# Patient Record
Sex: Male | Born: 1950
Health system: Southern US, Community
[De-identification: ages and names within clinical notes are randomized; demographics above are authoritative.]

## PROBLEM LIST (undated history)

## (undated) DIAGNOSIS — J309 Allergic rhinitis, unspecified: Secondary | ICD-10-CM

## (undated) DIAGNOSIS — H9313 Tinnitus, bilateral: Secondary | ICD-10-CM

## (undated) DIAGNOSIS — M48061 Spinal stenosis, lumbar region without neurogenic claudication: Secondary | ICD-10-CM

## (undated) DIAGNOSIS — E785 Hyperlipidemia, unspecified: Secondary | ICD-10-CM

## (undated) DIAGNOSIS — D126 Benign neoplasm of colon, unspecified: Secondary | ICD-10-CM

## (undated) DIAGNOSIS — E8809 Other disorders of plasma-protein metabolism, not elsewhere classified: Secondary | ICD-10-CM

## (undated) DIAGNOSIS — K589 Irritable bowel syndrome without diarrhea: Secondary | ICD-10-CM

## (undated) DIAGNOSIS — N4341 Spermatocele of epididymis, single: Secondary | ICD-10-CM

## (undated) DIAGNOSIS — E039 Hypothyroidism, unspecified: Secondary | ICD-10-CM

## (undated) DIAGNOSIS — E079 Disorder of thyroid, unspecified: Secondary | ICD-10-CM

## (undated) DIAGNOSIS — H8110 Benign paroxysmal vertigo, unspecified ear: Secondary | ICD-10-CM

## (undated) DIAGNOSIS — R918 Other nonspecific abnormal finding of lung field: Secondary | ICD-10-CM

## (undated) DIAGNOSIS — E559 Vitamin D deficiency, unspecified: Secondary | ICD-10-CM

## (undated) DIAGNOSIS — N503 Cyst of epididymis: Secondary | ICD-10-CM

## (undated) DIAGNOSIS — G629 Polyneuropathy, unspecified: Secondary | ICD-10-CM

## (undated) HISTORY — DX: Spermatocele of epididymis, single: N43.41

## (undated) HISTORY — DX: Allergic rhinitis, unspecified: J30.9

## (undated) HISTORY — DX: Benign neoplasm of colon, unspecified: D12.6

## (undated) HISTORY — DX: Tinnitus, bilateral: H93.13

## (undated) HISTORY — PX: APPENDECTOMY: SHX54

## (undated) HISTORY — DX: Other disorders of plasma-protein metabolism, not elsewhere classified: E88.09

## (undated) HISTORY — DX: Polyneuropathy, unspecified: G62.9

## (undated) HISTORY — DX: Hyperlipidemia, unspecified: E78.5

## (undated) HISTORY — DX: Benign paroxysmal vertigo, unspecified ear: H81.10

## (undated) HISTORY — PX: OTHER SURGICAL HISTORY: SHX169

## (undated) HISTORY — DX: Irritable bowel syndrome, unspecified: K58.9

## (undated) HISTORY — DX: Hypothyroidism, unspecified: E03.9

## (undated) HISTORY — PX: COLONOSCOPY: SHX174

## (undated) HISTORY — DX: Cyst of epididymis: N50.3

## (undated) HISTORY — DX: Spinal stenosis, lumbar region without neurogenic claudication: M48.061

## (undated) HISTORY — DX: Vitamin D deficiency, unspecified: E55.9

## (undated) HISTORY — DX: Other nonspecific abnormal finding of lung field: R91.8

---

## 2006-12-24 ENCOUNTER — Ambulatory Visit (HOSPITAL_COMMUNITY): Admission: RE | Admit: 2006-12-24 | Discharge: 2006-12-24 | Payer: Self-pay | Admitting: Gastroenterology

## 2006-12-24 ENCOUNTER — Encounter (INDEPENDENT_AMBULATORY_CARE_PROVIDER_SITE_OTHER): Payer: Self-pay | Admitting: Gastroenterology

## 2010-05-28 NOTE — Op Note (Signed)
Jesus Sweeney, Jesus Sweeney               ACCOUNT NO.:  [id number]   MEDICAL RECORD NO.:  [id number]          PATIENT TYPE:  AMB   LOCATION:  ENDO                         FACILITY:  [facility]   PHYSICIAN:  John C. Madilyn Fireman, M.D.    DATE OF BIRTH:  16-Sep-1950   DATE OF PROCEDURE:  12/24/2006  DATE OF DISCHARGE:                               OPERATIVE REPORT   INDICATIONS FOR PROCEDURE:  Average risk colon cancer screening.   PROCEDURE:  The patient was placed in the left lateral decubitus  position and placed on the pulse monitor with continuous low-flow oxygen  delivered by nasal cannula.  He was sedated with 100 mcg IV fentanyl and  10 mg IV Versed.  The Olympus video colonoscope was inserted into the  rectum and advanced to the cecum, confirmed by transillumination of  McBurney's point and visualization of the ileocecal valve and  appendiceal orifice.  Prep was excellent.  The cecum appeared normal  with no masses, polyps, diverticula or other mucosal abnormalities.  Within the ascending colon, there was a single 6 mm polyp that was  fulgurated by hot biopsy.  The remainder of the ascending, transverse  descending, sigmoid and rectum appeared normal with no further polyps,  masses, diverticula or other mucosal abnormalities.  The scope was then  withdrawn and the patient returned to the recovery room in stable  condition.  He tolerated the procedure well.  There were no immediate  complications.   IMPRESSION:  Ascending colon polyp.   PLAN:  Await histology to determine interval for future colon screening.           ______________________________  Everardo All. Madilyn Fireman, M.D.     [facility]  D:  12/24/2006  T:  12/24/2006  Job:  [id number]   cc:   Juline Patch, M.D.  Fax: [phone]

## 2012-03-02 ENCOUNTER — Encounter (HOSPITAL_COMMUNITY): Payer: Self-pay | Admitting: *Deleted

## 2012-03-02 ENCOUNTER — Emergency Department (HOSPITAL_COMMUNITY)
Admission: EM | Admit: 2012-03-02 | Discharge: 2012-03-02 | Disposition: A | Discharge status: Home / Self Care | Payer: 59 | Attending: Emergency Medicine | Admitting: Emergency Medicine

## 2012-03-02 ENCOUNTER — Emergency Department (HOSPITAL_COMMUNITY): Payer: 59

## 2012-03-02 DIAGNOSIS — W19XXXA Unspecified fall, initial encounter: Secondary | ICD-10-CM

## 2012-03-02 DIAGNOSIS — Y9301 Activity, walking, marching and hiking: Secondary | ICD-10-CM | POA: Insufficient documentation

## 2012-03-02 DIAGNOSIS — S20219A Contusion of unspecified front wall of thorax, initial encounter: Secondary | ICD-10-CM | POA: Insufficient documentation

## 2012-03-02 DIAGNOSIS — Z79899 Other long term (current) drug therapy: Secondary | ICD-10-CM | POA: Insufficient documentation

## 2012-03-02 DIAGNOSIS — S3981XA Other specified injuries of abdomen, initial encounter: Secondary | ICD-10-CM | POA: Insufficient documentation

## 2012-03-02 DIAGNOSIS — F1010 Alcohol abuse, uncomplicated: Secondary | ICD-10-CM | POA: Insufficient documentation

## 2012-03-02 DIAGNOSIS — W1809XA Striking against other object with subsequent fall, initial encounter: Secondary | ICD-10-CM | POA: Insufficient documentation

## 2012-03-02 DIAGNOSIS — E039 Hypothyroidism, unspecified: Secondary | ICD-10-CM | POA: Insufficient documentation

## 2012-03-02 DIAGNOSIS — IMO0002 Reserved for concepts with insufficient information to code with codable children: Secondary | ICD-10-CM | POA: Insufficient documentation

## 2012-03-02 DIAGNOSIS — Y929 Unspecified place or not applicable: Secondary | ICD-10-CM | POA: Insufficient documentation

## 2012-03-02 HISTORY — DX: Disorder of thyroid, unspecified: E07.9

## 2012-03-02 LAB — BASIC METABOLIC PANEL
BUN: 29 mg/dL — ABNORMAL HIGH (ref 6–23)
CO2: 28 mEq/L (ref 19–32)
Chloride: 100 mEq/L (ref 96–112)
Creatinine, Ser: 1.03 mg/dL (ref 0.50–1.35)

## 2012-03-02 LAB — CBC
HCT: 43.4 % (ref 39.0–52.0)
MCV: 87.5 fL (ref 78.0–100.0)
RBC: 4.96 MIL/uL (ref 4.22–5.81)
RDW: 12.5 % (ref 11.5–15.5)
WBC: 13.8 10*3/uL — ABNORMAL HIGH (ref 4.0–10.5)

## 2012-03-02 MED ORDER — OXYCODONE-ACETAMINOPHEN 5-325 MG PO TABS
2.0000 | ORAL_TABLET | Freq: Once | ORAL | Status: AC
  Administered 2012-03-02: 1 via ORAL
  Filled 2012-03-02: qty 2

## 2012-03-02 MED ORDER — OXYCODONE-ACETAMINOPHEN 5-325 MG PO TABS: 1.0000 | ORAL_TABLET | Freq: Four times a day (QID) | ORAL | Status: DC | PRN

## 2012-03-02 MED ORDER — IOHEXOL 300 MG/ML  SOLN
100.0000 mL | Freq: Once | INTRAMUSCULAR | Status: AC | PRN
  Administered 2012-03-02: 80 mL via INTRAVENOUS

## 2012-03-02 NOTE — ED Provider Notes (Signed)
Medical screening examination/treatment/procedure(s) were performed by non-physician practitioner and as supervising physician I was immediately available for consultation/collaboration.  Gilda Crease, MD 03/02/12 [phone]

## 2012-03-02 NOTE — ED Notes (Signed)
Pt was walking on a board, 1 end popped up, and caught him on right on the bottom of his ribs and he slid scraping his abd.  Pt feels that every time he breaths the lower ribs "wiggle" every time he breaths.

## 2012-03-02 NOTE — ED Provider Notes (Signed)
History     CSN: [id number]  Arrival date & time 03/02/12  1242   First MD Initiated Contact with Patient 03/02/12 1458      Chief Complaint  Patient presents with  . Fall    (Consider location/radiation/quality/duration/timing/severity/associated sxs/prior treatment) HPI Comments: 62 year old male presents to the emergency department complaining of bilateral lower rib and upper abdominal pain after falling on a board from his back about 3 hours prior to arrival. Patient states one side was "wiggly", he stepped on a board came up and hit him in the ribs and scraped his abdomen.since the fall patient states it is difficult to take a deep breath and without pain. Describes the pain as "spasms" rated 8/10. Moving into different positions make the pain worse. He has not tried any alleviating factors. Denies shortness of breath, however states it is difficult to take a deep breath. Denies nausea, vomiting or hematuria.  Patient is a 62 y.o. male presenting with fall. The history is provided by the patient and the spouse.  Fall Associated symptoms include abdominal pain. Pertinent negatives include no nausea, no vomiting and no hematuria.    Past Medical History  Diagnosis Date  . Thyroid disease     hypothyroidism    Past Surgical History  Procedure Laterality Date  . Appendectomy      No family history on file.  History  Substance Use Topics  . Smoking status: Never Smoker   . Smokeless tobacco: Not on file  . Alcohol Use: Yes     Comment: occasionally      Review of Systems  HENT: Negative for voice change.   Respiratory: Negative for apnea and shortness of breath.   Cardiovascular: Negative for chest pain.  Gastrointestinal: Positive for abdominal pain. Negative for nausea and vomiting.  Genitourinary: Negative for hematuria.  Musculoskeletal:       Positive for rib pain.  Skin: Positive for wound.  Neurological: Negative for dizziness and light-headedness.    Psychiatric/Behavioral: Negative for confusion.  All other systems reviewed and are negative.    Allergies  Eggs or egg-derived products  Home Medications   Current Outpatient Rx  Name  Route  Sig  Dispense  Refill  . cetirizine (ZYRTEC) 10 MG tablet   Oral   Take 10 mg by mouth daily as needed for allergies.         Marland Kitchen levothyroxine (SYNTHROID, LEVOTHROID) 75 MCG tablet   Oral   Take 75 mcg by mouth daily.           BP 142/79  Pulse 55  SpO2 100%  Physical Exam  Nursing note and vitals reviewed. Constitutional: He is oriented to person, place, and time. He appears well-developed and well-nourished. No distress.  HENT:  Head: Normocephalic and atraumatic.  Mouth/Throat: Oropharynx is clear and moist.  Eyes: Conjunctivae and EOM are normal. Pupils are equal, round, and reactive to light.  Neck: Normal range of motion. Neck supple.  Cardiovascular: Normal rate, regular rhythm, normal heart sounds and intact distal pulses.   Pulmonary/Chest: Effort normal and breath sounds normal. No respiratory distress. He has no decreased breath sounds. He exhibits tenderness (no evidence of step-off).    Abdominal: Soft. Normal appearance and bowel sounds are normal. There is tenderness in the right upper quadrant and epigastric area. There is no rigidity, no rebound and no guarding.  Musculoskeletal: Normal range of motion. He exhibits no edema.  Neurological: He is alert and oriented to person, place, and time.  Skin: Skin is warm and dry. Abrasion (multiple abrasions on abdomen and lower chest) noted.  Psychiatric: He has a normal mood and affect. His behavior is normal.    ED Course  Procedures (including critical care time)  Labs Reviewed - No data to display Dg Chest 2 View  03/02/2012  *RADIOLOGY REPORT*  Clinical Data: Larey Seat.  Chest pain.  CHEST - 2 VIEW  Comparison: None  Findings: The cardiac silhouette, mediastinal and hilar contours are normal.  The lungs are clear.   No pleural effusion.  The bony thorax is intact.  IMPRESSION: No acute cardiopulmonary findings.   Original Report Authenticated By: Rudie Meyer, M.D.    Ct Chest W Contrast  03/02/2012  *RADIOLOGY REPORT*  Clinical Data:  Fall  CT CHEST, ABDOMEN AND PELVIS WITH CONTRAST  Technique:  Multidetector CT imaging of the chest, abdomen and pelvis was performed following the standard protocol during bolus administration of intravenous contrast.  Contrast: 80mL OMNIPAQUE IOHEXOL 300 MG/ML  SOLN  Comparison:   None.  CT CHEST  Findings:  Negative abnormal mediastinal adenopathy.  Negative mediastinal hemorrhage.  No pericardial effusion.  Minimal coronary artery calcifications involving the left anterior descending and circumflex vessels.  No pneumothorax.  No pleural effusion.  4 mm right upper lobe nodule on image 26.  6 mm nodule in the superior segment of the left lower lobe on image 28.  No acute bony deformity.  IMPRESSION: No acute intrathoracic injury.  Bilateral lung nodules.  The largest on the left is 6 mm. If the patient is at high risk for bronchogenic carcinoma, follow-up chest CT at 6-12 months is recommended.  If the patient is at low risk for bronchogenic carcinoma, follow-up chest CT at 12 months is recommended.  This recommendation follows the consensus statement: Guidelines for Management of Small Pulmonary Nodules Detected on CT Scans: A Statement from the Fleischner Society as published in Radiology 2005; 237:395-400.  CT ABDOMEN AND PELVIS  Findings:  The liver, gallbladder, spleen, pancreas, adrenal glands, and kidneys are within normal limits.  Atherosclerotic vascular calcifications involving the aorta and iliac arteries are noted.  No free fluid.  No intraperitoneal hemorrhage.  Bladder and prostate are within normal limits.  Moderate stool burden throughout the length of the colon.  No acute bony deformity.  Lower lumbar facet arthropathy.  IMPRESSION: No acute intra-abdominal or intrapelvic  pathology.   Original Report Authenticated By: Jolaine Click, M.D.    Ct Abdomen Pelvis W Contrast  03/02/2012  *RADIOLOGY REPORT*  Clinical Data:  Fall  CT CHEST, ABDOMEN AND PELVIS WITH CONTRAST  Technique:  Multidetector CT imaging of the chest, abdomen and pelvis was performed following the standard protocol during bolus administration of intravenous contrast.  Contrast: 80mL OMNIPAQUE IOHEXOL 300 MG/ML  SOLN  Comparison:   None.  CT CHEST  Findings:  Negative abnormal mediastinal adenopathy.  Negative mediastinal hemorrhage.  No pericardial effusion.  Minimal coronary artery calcifications involving the left anterior descending and circumflex vessels.  No pneumothorax.  No pleural effusion.  4 mm right upper lobe nodule on image 26.  6 mm nodule in the superior segment of the left lower lobe on image 28.  No acute bony deformity.  IMPRESSION: No acute intrathoracic injury.  Bilateral lung nodules.  The largest on the left is 6 mm. If the patient is at high risk for bronchogenic carcinoma, follow-up chest CT at 6-12 months is recommended.  If the patient is at low risk for bronchogenic carcinoma, follow-up  chest CT at 12 months is recommended.  This recommendation follows the consensus statement: Guidelines for Management of Small Pulmonary Nodules Detected on CT Scans: A Statement from the Fleischner Society as published in Radiology 2005; 237:395-400.  CT ABDOMEN AND PELVIS  Findings:  The liver, gallbladder, spleen, pancreas, adrenal glands, and kidneys are within normal limits.  Atherosclerotic vascular calcifications involving the aorta and iliac arteries are noted.  No free fluid.  No intraperitoneal hemorrhage.  Bladder and prostate are within normal limits.  Moderate stool burden throughout the length of the colon.  No acute bony deformity.  Lower lumbar facet arthropathy.  IMPRESSION: No acute intra-abdominal or intrapelvic pathology.   Original Report Authenticated By: Jolaine Click, M.D.      1.  Fall   2. Rib contusion       MDM  62 year old male with rib contusion after fall. Pain controlled in the emergency department with one Percocet.lungs sounds normal. Vitals stable. He is in no apparent distress. CT abdomen/pelvis and chest negative for any acute or emergent abnormality. Incidental bilateral pulmonary nodules seen on CT. I informed patient of this. He'll discuss this with his PCP. Percocet given at discharge along with incentive spirometry. Return precautions discussed. Patient and wife state their understanding of plan and are agreeable.        Trevor Mace, PA-C 03/02/12 1800

## 2012-07-01 ENCOUNTER — Other Ambulatory Visit: Payer: Self-pay | Admitting: Internal Medicine

## 2012-07-01 DIAGNOSIS — R911 Solitary pulmonary nodule: Secondary | ICD-10-CM

## 2012-08-23 ENCOUNTER — Ambulatory Visit
Admission: RE | Admit: 2012-08-23 | Discharge: 2012-08-23 | Disposition: A | Discharge status: Home / Self Care | Payer: 59 | Source: Ambulatory Visit | Attending: Internal Medicine | Admitting: Internal Medicine

## 2012-08-23 DIAGNOSIS — R911 Solitary pulmonary nodule: Secondary | ICD-10-CM

## 2013-06-29 ENCOUNTER — Other Ambulatory Visit: Payer: Self-pay | Admitting: Otolaryngology

## 2013-06-29 DIAGNOSIS — M542 Cervicalgia: Secondary | ICD-10-CM

## 2013-06-30 ENCOUNTER — Other Ambulatory Visit (HOSPITAL_COMMUNITY): Payer: Self-pay | Admitting: Otolaryngology

## 2013-06-30 DIAGNOSIS — M542 Cervicalgia: Secondary | ICD-10-CM

## 2013-07-04 ENCOUNTER — Other Ambulatory Visit: Payer: Self-pay | Admitting: Otolaryngology

## 2013-07-04 ENCOUNTER — Ambulatory Visit (HOSPITAL_COMMUNITY)
Admission: RE | Admit: 2013-07-04 | Discharge: 2013-07-04 | Disposition: A | Discharge status: Home / Self Care | Payer: 59 | Source: Ambulatory Visit | Attending: Otolaryngology | Admitting: Otolaryngology

## 2013-07-04 ENCOUNTER — Other Ambulatory Visit: Payer: 59

## 2013-07-04 DIAGNOSIS — M542 Cervicalgia: Secondary | ICD-10-CM | POA: Insufficient documentation

## 2013-07-04 LAB — POCT I-STAT CREATININE: CREATININE: 1.2 mg/dL (ref 0.50–1.35)

## 2013-07-04 MED ORDER — IOHEXOL 300 MG/ML  SOLN
80.0000 mL | Freq: Once | INTRAMUSCULAR | Status: AC | PRN
  Administered 2013-07-04: 80 mL via INTRAVENOUS

## 2014-07-11 ENCOUNTER — Other Ambulatory Visit: Payer: Self-pay | Admitting: Internal Medicine

## 2014-07-11 DIAGNOSIS — R911 Solitary pulmonary nodule: Secondary | ICD-10-CM

## 2014-07-13 ENCOUNTER — Ambulatory Visit
Admission: RE | Admit: 2014-07-13 | Discharge: 2014-07-13 | Disposition: A | Discharge status: Home / Self Care | Payer: 59 | Source: Ambulatory Visit | Attending: Internal Medicine | Admitting: Internal Medicine

## 2014-07-13 DIAGNOSIS — R911 Solitary pulmonary nodule: Secondary | ICD-10-CM

## 2014-09-10 ENCOUNTER — Emergency Department (HOSPITAL_COMMUNITY)
Admission: EM | Admit: 2014-09-10 | Discharge: 2014-09-10 | Disposition: A | Discharge status: Home / Self Care | Payer: 59 | Source: Home / Self Care

## 2014-09-10 ENCOUNTER — Encounter (HOSPITAL_COMMUNITY): Payer: Self-pay | Admitting: Emergency Medicine

## 2014-09-10 DIAGNOSIS — T148 Other injury of unspecified body region: Secondary | ICD-10-CM

## 2014-09-10 DIAGNOSIS — W57XXXA Bitten or stung by nonvenomous insect and other nonvenomous arthropods, initial encounter: Secondary | ICD-10-CM

## 2014-09-10 NOTE — ED Provider Notes (Signed)
CSN: [id number]     Arrival date & time 09/10/14  1958 History   None    Chief Complaint  Patient presents with  . Insect Bite   (Consider location/radiation/quality/duration/timing/severity/associated sxs/prior Treatment) The history is provided by the patient. No language interpreter was used.  Patient presents for complaint of area of redness/swelling along the right side of neck, first noticed this morning while sitting for breakfast.  Has been busy cleaning out barn, unaware of an event when he would have been stung/bitten.  No other areas of swelling or redness. No known tick exposure.  Has not taken anything for this today.    No fevers or chills, no shortness of breath or cough.  No other skin eruptions.   Past Medical History  Diagnosis Date  . Thyroid disease     hypothyroidism   Past Surgical History  Procedure Laterality Date  . Appendectomy     History reviewed. No pertinent family history. Social History  Substance Use Topics  . Smoking status: Never Smoker   . Smokeless tobacco: None  . Alcohol Use: Yes     Comment: occasionally    Review of Systems  Constitutional: Negative.  Negative for fever, chills, activity change and appetite change.  HENT: Negative for congestion, ear discharge and facial swelling.   Respiratory: Negative for cough, chest tightness, shortness of breath, wheezing and stridor.   Cardiovascular: Negative for chest pain.    Allergies  Eggs or egg-derived products  Home Medications   Prior to Admission medications   Medication Sig Start Date End Date Taking? Authorizing Provider  levothyroxine (SYNTHROID, LEVOTHROID) 75 MCG tablet Take 75 mcg by mouth daily.   Yes Historical Provider, MD  cetirizine (ZYRTEC) 10 MG tablet Take 10 mg by mouth daily as needed for allergies.    Historical Provider, MD  oxyCODONE-acetaminophen (PERCOCET) 5-325 MG per tablet Take 1-2 tablets by mouth every 6 (six) hours as needed for pain. 03/02/12   Carman Ching, PA-C   Meds Ordered and Administered this Visit  Medications - No data to display  BP 130/78 mmHg  Pulse 56  Temp(Src) 98.2 F (36.8 C) (Oral)  SpO2 97% No data found.   Physical Exam  Constitutional: He appears well-developed and well-nourished. No distress.  HENT:  Head: Normocephalic and atraumatic.  Area on right side of neck with raised wheal; raised area measures 1.75cm diameter; surrounding erythema measures 2.75cm diameter. No fluctuance. No other areas of induration or erythema. No cervical adenopathy noted. Full active ROM neck in all planes.  Neck: Normal range of motion. Neck supple.  Lymphadenopathy:    He has no cervical adenopathy.  Skin: He is not diaphoretic.    ED Course  Procedures (including critical care time)  Labs Review Labs Reviewed - No data to display  Imaging Review No results found.   Visual Acuity Review  Right Eye Distance:   Left Eye Distance:   Bilateral Distance:    Right Eye Near:   Left Eye Near:    Bilateral Near:         MDM   1. Arthropod bite        Willeen Niece, MD 09/10/14 [phone]

## 2014-09-10 NOTE — ED Notes (Signed)
The patient presented with what he believes to be an insect bite to the right side of his neck. The patient stated that he noticed the bite today.

## 2014-09-10 NOTE — Discharge Instructions (Signed)
It was a pleasure to see you today.  I believe the mark on the right side of your neck is a bite/sting of some sort.  I recommend taking Benadryl 25mg  tablet by mouth tonight, and applying cold compress.   Please call your primary physician if area of redness/swelling increases in size, if becomes painful, of if fever/chills develop.

## 2014-11-09 IMAGING — CT CT NECK W/ CM
4 series · 15 of 33 positions shown, 18 images · IV contrast (APPLIED)
Comparison: None.

CLINICAL DATA: Episode of choking on a peanut 6 weeks ago. Sharp
right-sided neck pain subsequent to that.

EXAM:
CT NECK WITH CONTRAST
TECHNIQUE: Multidetector CT imaging of the neck was performed using the
standard protocol following the bolus administration of intravenous
contrast.
CONTRAST:  80mL OMNIPAQUE IOHEXOL 300 MG/ML  SOLN

[Series 3: neck 2.0 i31s 3 · axial · 0.35mm/px · z∈[-132,+28]mm · 5 of 122 slices shown, 7 images]
[im 21/122  soft-tissue]
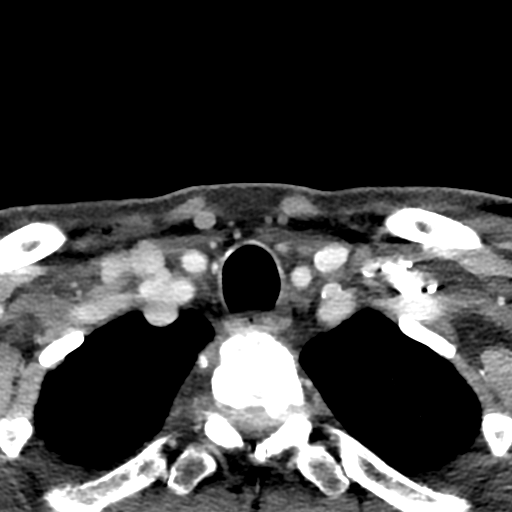
[im 21/122  bone]
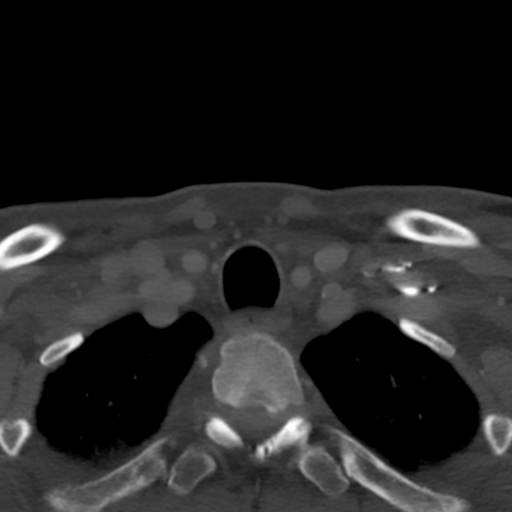
[im 41/122  bone]
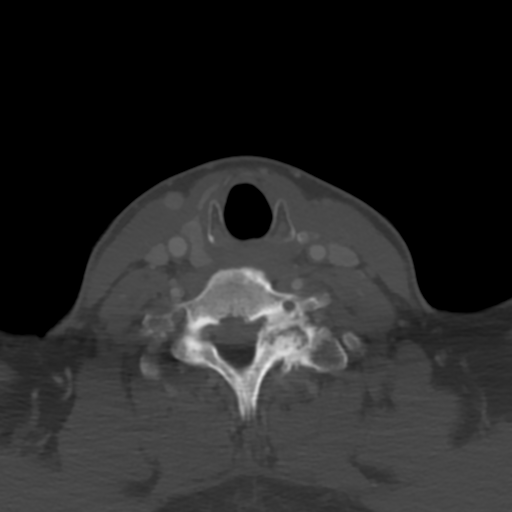
[im 61/122  bone]
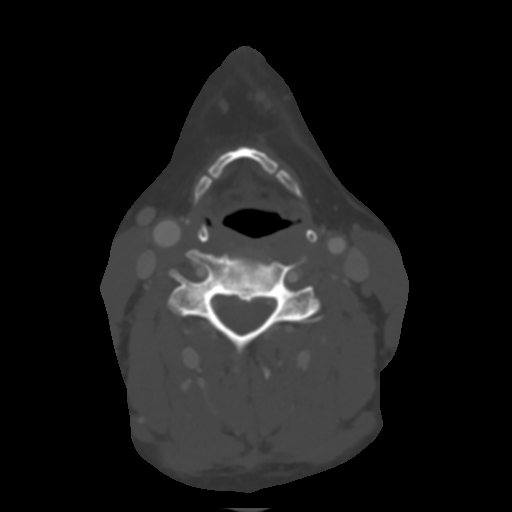
[im 81/122  bone]
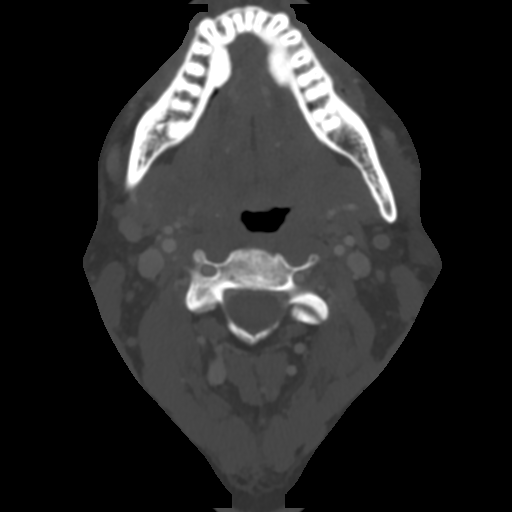
[im 101/122  soft-tissue]
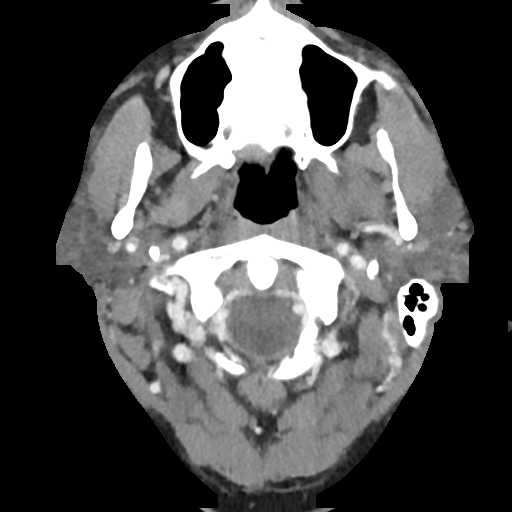
[im 101/122  bone]
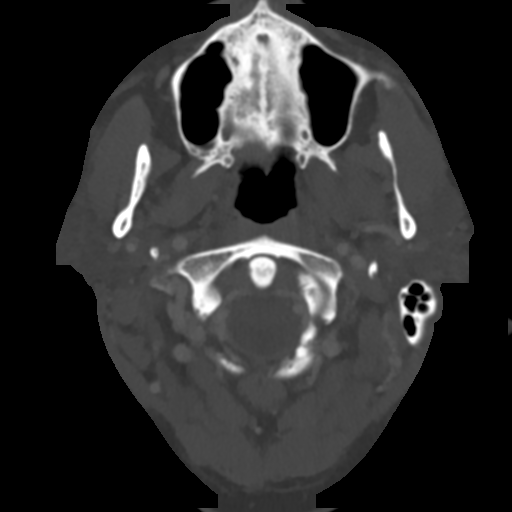

[Series 7: coronal st · coronal · 1.02mm/px · 3 of 83 slices shown]
[im 17/83  bone]
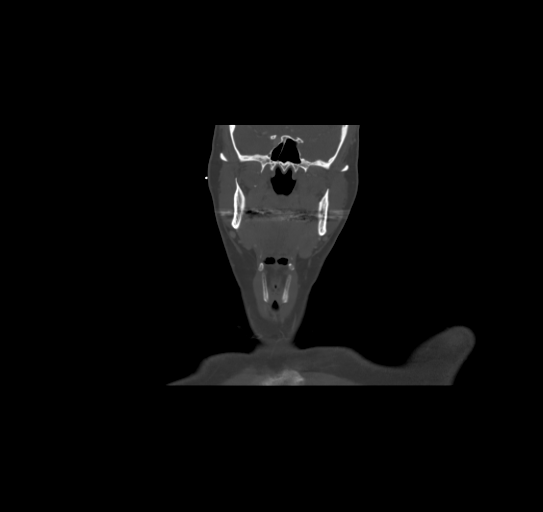
[im 33/83  bone]
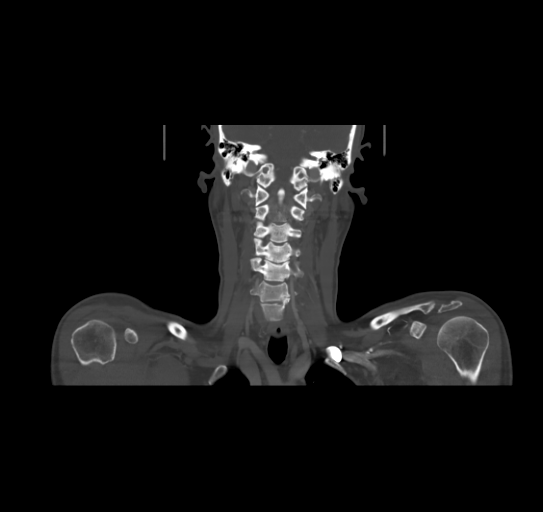
[im 50/83  bone]
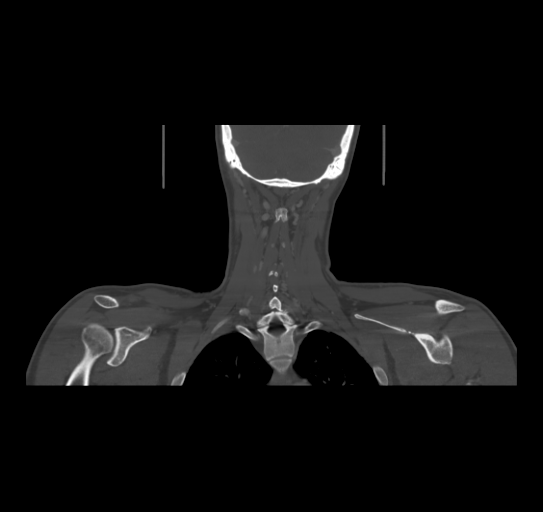

[Series 8: sagittal st · sagittal · 0.86mm/px · 5 of 98 slices shown, 6 images]
[im 33/98  bone]
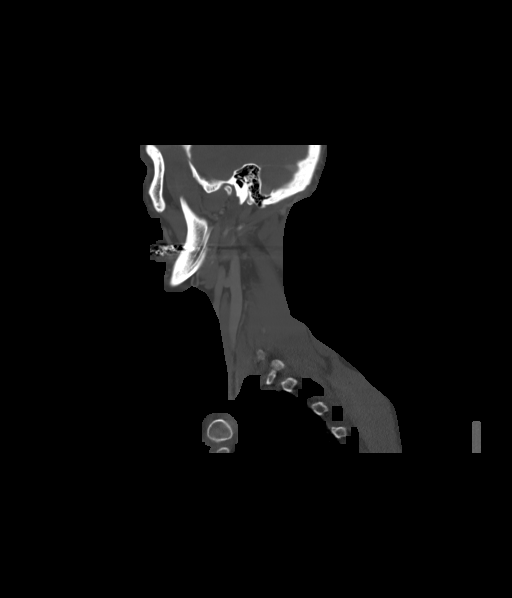
[im 41/98  bone]
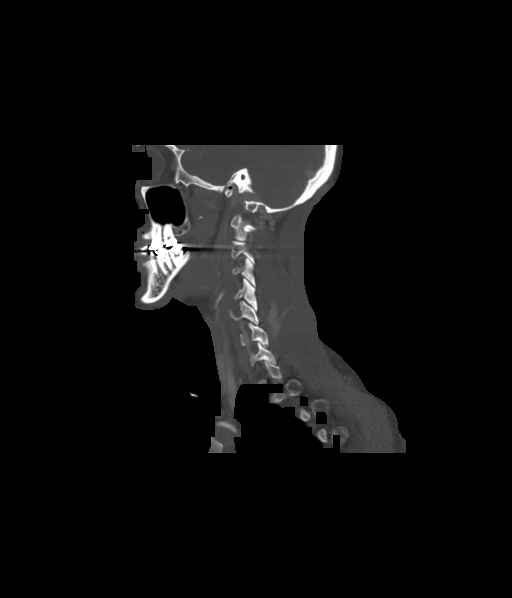
[im 49/98  soft-tissue]
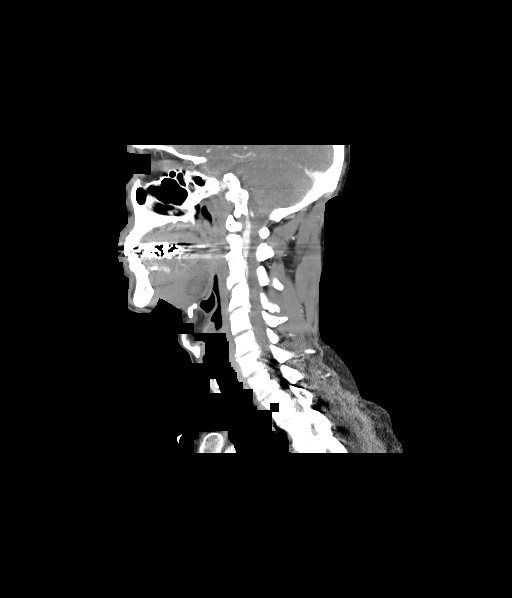
[im 49/98  bone]
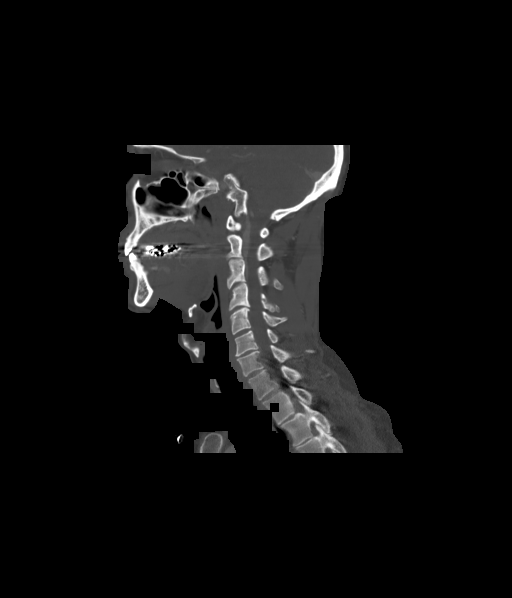
[im 57/98  bone]
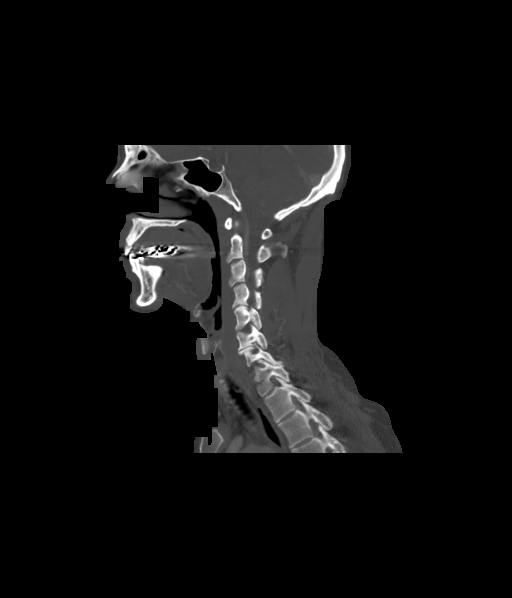
[im 65/98  bone]
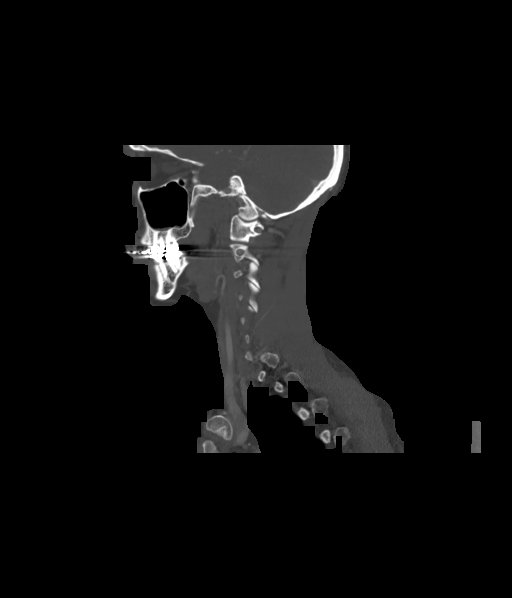

[Series 9: orthogonal st · axial · 0.39mm/px · z∈[-125,-83]mm · 2 of 125 slices shown]
[im 21/125  bone]
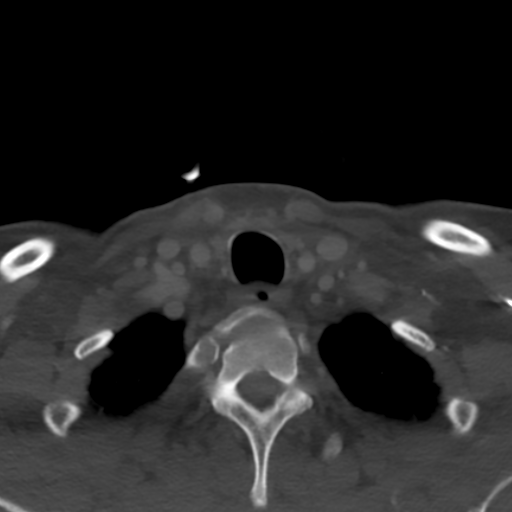
[im 42/125  bone]
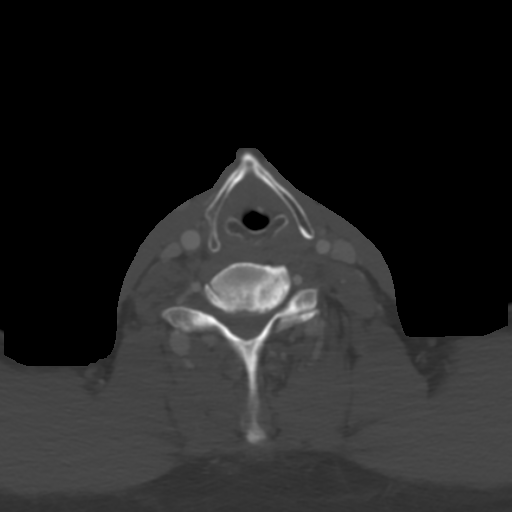

[15 of 33 positions shown; findings below may reference images not displayed]

FINDINGS: Lung apices are clear. No superior mediastinal pathology. Visualized
intracranial structures appear normal. Visualized paranasal sinuses
are clear.

Both parotid glands are normal. Both submandibular glands are
normal. The thyroid gland is normal.

Arterial and venous structures are patent. The patient has some
atherosclerotic calcification at the right carotid bifurcation
region but no evidence of stenosis.

No mucosal or submucosal lesion is seen. No evidence of air/ gas in
the neck or any soft tissue or fluid density lesion in the neck.
There are no enlarged lymph nodes on either side. There is ordinary
degenerative spondylosis.
IMPRESSION: No finding to explain the symptoms. No evidence of abnormal air/ gas
or fluid in the region.

## 2014-11-17 ENCOUNTER — Other Ambulatory Visit: Payer: Self-pay | Admitting: Internal Medicine

## 2014-11-17 DIAGNOSIS — R748 Abnormal levels of other serum enzymes: Secondary | ICD-10-CM

## 2014-11-23 ENCOUNTER — Ambulatory Visit
Admission: RE | Admit: 2014-11-23 | Discharge: 2014-11-23 | Disposition: A | Discharge status: Home / Self Care | Payer: 59 | Source: Ambulatory Visit | Attending: Internal Medicine | Admitting: Internal Medicine

## 2014-11-23 ENCOUNTER — Other Ambulatory Visit: Payer: Self-pay | Admitting: Internal Medicine

## 2014-11-23 DIAGNOSIS — N5089 Other specified disorders of the male genital organs: Secondary | ICD-10-CM

## 2014-11-23 DIAGNOSIS — R748 Abnormal levels of other serum enzymes: Secondary | ICD-10-CM

## 2015-04-25 MED FILL — SYNTHROID 88 MCG TABLET: 88 | 90 days supply | Qty: 90 | Fill #1

## 2015-05-15 DIAGNOSIS — E559 Vitamin D deficiency, unspecified: Secondary | ICD-10-CM | POA: Diagnosis not present

## 2015-05-15 DIAGNOSIS — E785 Hyperlipidemia, unspecified: Secondary | ICD-10-CM | POA: Diagnosis not present

## 2015-05-22 DIAGNOSIS — E785 Hyperlipidemia, unspecified: Secondary | ICD-10-CM | POA: Diagnosis not present

## 2015-05-22 DIAGNOSIS — E559 Vitamin D deficiency, unspecified: Secondary | ICD-10-CM | POA: Diagnosis not present

## 2015-05-22 DIAGNOSIS — J309 Allergic rhinitis, unspecified: Secondary | ICD-10-CM | POA: Diagnosis not present

## 2015-05-22 DIAGNOSIS — E039 Hypothyroidism, unspecified: Secondary | ICD-10-CM | POA: Diagnosis not present

## 2015-08-09 MED FILL — SYNTHROID 88 MCG TABLET: 88 | 90 days supply | Qty: 90 | Fill #0

## 2015-10-16 DIAGNOSIS — J329 Chronic sinusitis, unspecified: Secondary | ICD-10-CM | POA: Diagnosis not present

## 2015-10-16 MED FILL — AZITHROMYCIN 250 MG TABLET: 250 | 5 days supply | Qty: 6 | Fill #0

## 2015-11-15 DIAGNOSIS — H524 Presbyopia: Secondary | ICD-10-CM | POA: Diagnosis not present

## 2015-11-20 DIAGNOSIS — E559 Vitamin D deficiency, unspecified: Secondary | ICD-10-CM | POA: Diagnosis not present

## 2015-11-20 DIAGNOSIS — Z125 Encounter for screening for malignant neoplasm of prostate: Secondary | ICD-10-CM | POA: Diagnosis not present

## 2015-11-20 DIAGNOSIS — Z0001 Encounter for general adult medical examination with abnormal findings: Secondary | ICD-10-CM | POA: Diagnosis not present

## 2015-11-27 DIAGNOSIS — E039 Hypothyroidism, unspecified: Secondary | ICD-10-CM | POA: Diagnosis not present

## 2015-11-27 DIAGNOSIS — E785 Hyperlipidemia, unspecified: Secondary | ICD-10-CM | POA: Diagnosis not present

## 2015-11-27 DIAGNOSIS — E559 Vitamin D deficiency, unspecified: Secondary | ICD-10-CM | POA: Diagnosis not present

## 2015-11-27 DIAGNOSIS — Z0000 Encounter for general adult medical examination without abnormal findings: Secondary | ICD-10-CM | POA: Diagnosis not present

## 2015-12-03 MED FILL — SYNTHROID 88 MCG TABLET: 88 | 90 days supply | Qty: 90 | Fill #1

## 2016-03-19 MED FILL — SYNTHROID 88 MCG TABLET: 88 | 90 days supply | Qty: 90 | Fill #0

## 2016-03-30 IMAGING — US US ABDOMEN LIMITED
1 series · 14 of 25 positions shown · non-contrast
Comparison: None.

CLINICAL DATA: Elevated LFTs

EXAM:
US ABDOMEN LIMITED - RIGHT UPPER QUADRANT

[Series 1: us abdomen limited · 0.21mm/px · 14 of 47 slices shown]
[im 1/47]
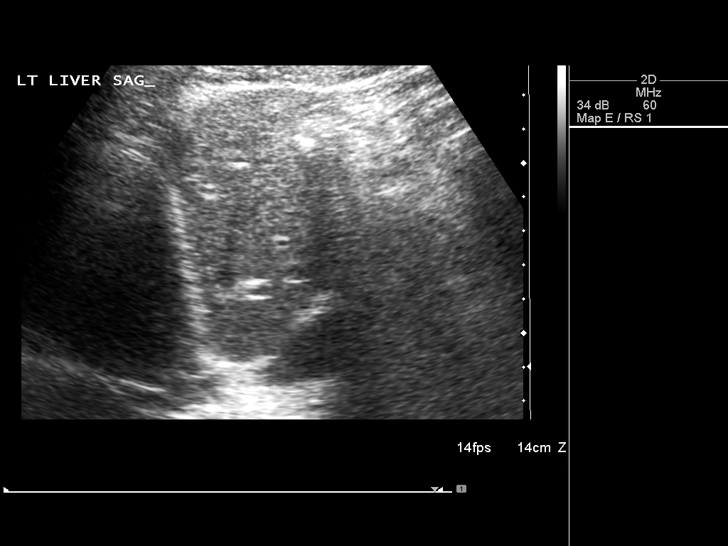
[im 4/47]
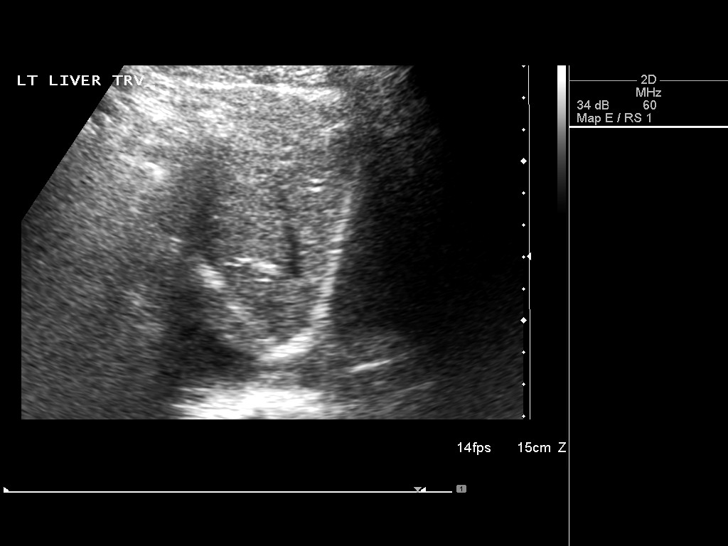
[im 8/47]
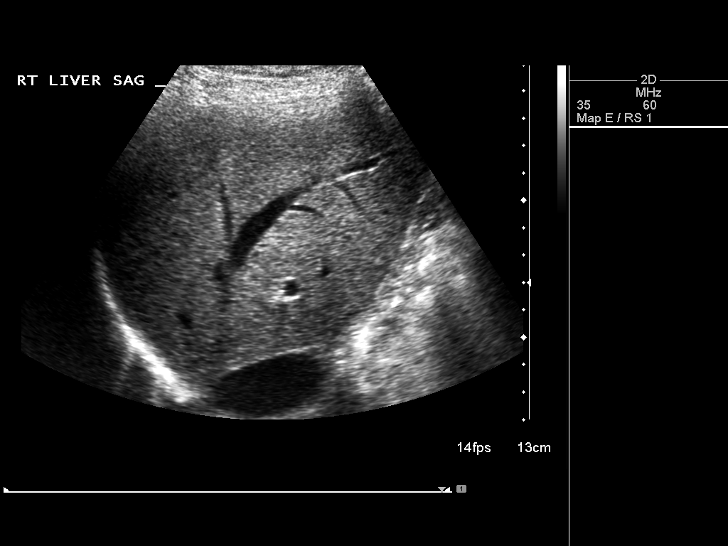
[im 12/47]
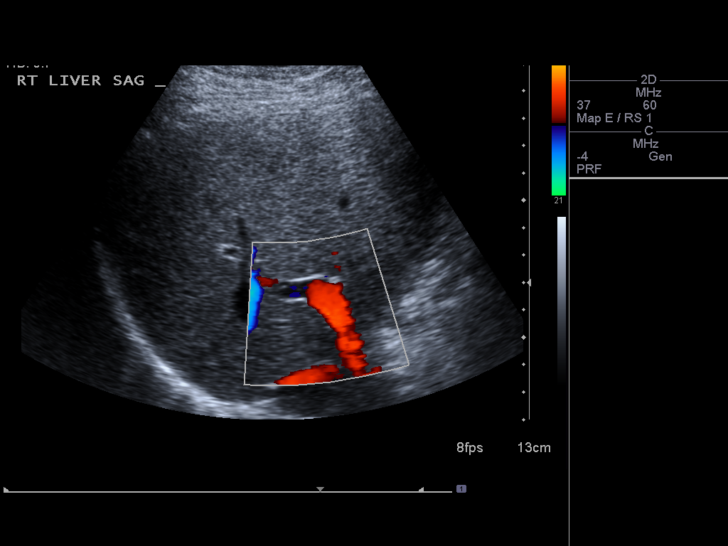
[im 16/47]
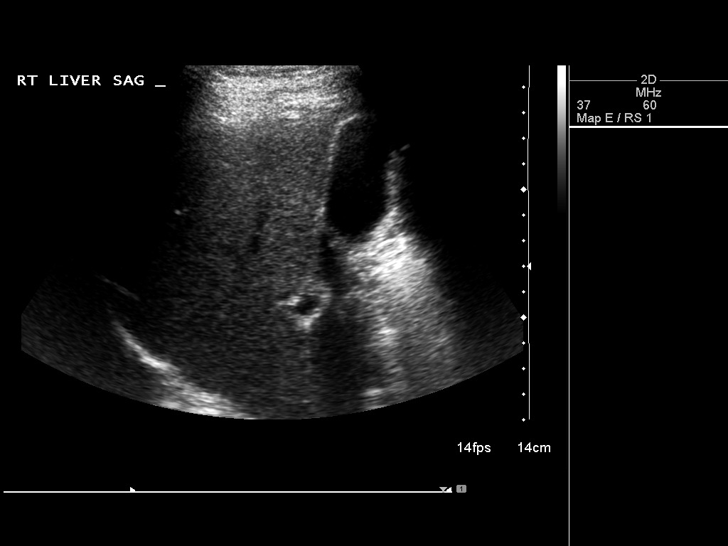
[im 18/47]
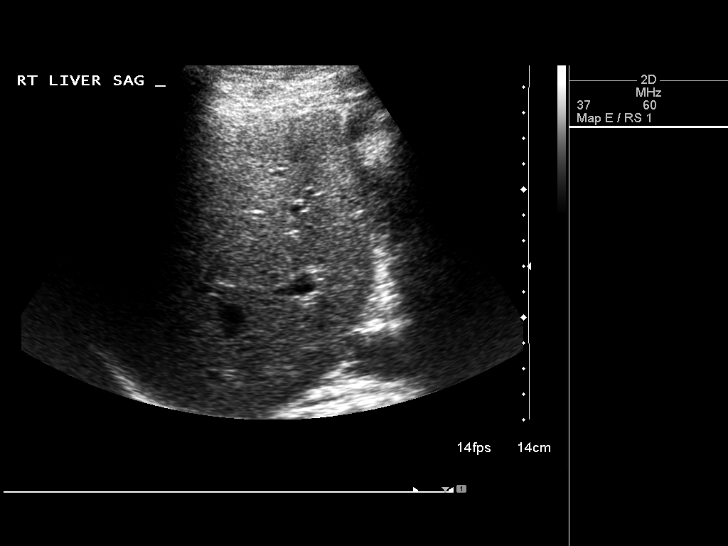
[im 22/47]
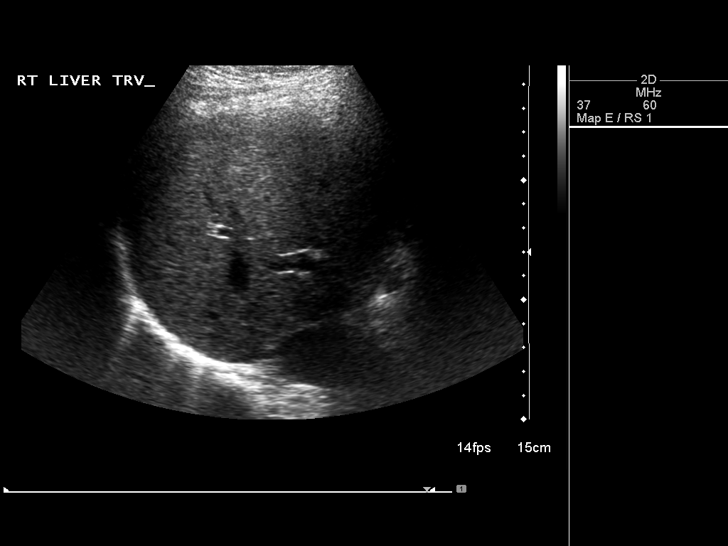
[im 25/47]
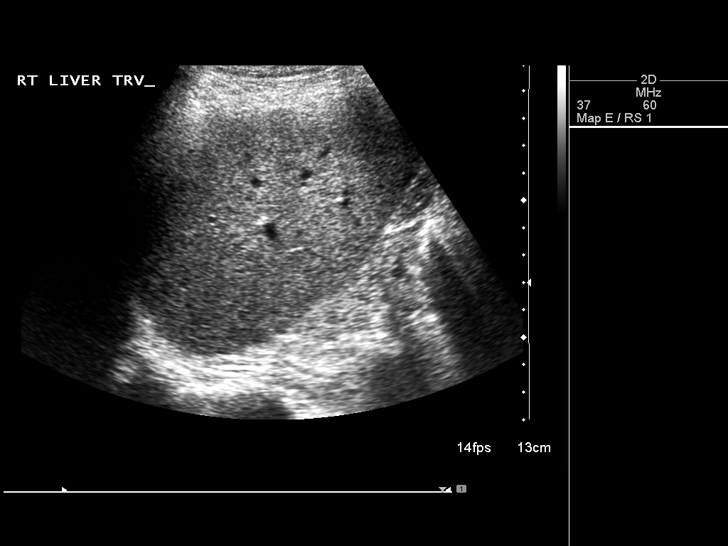
[im 29/47]
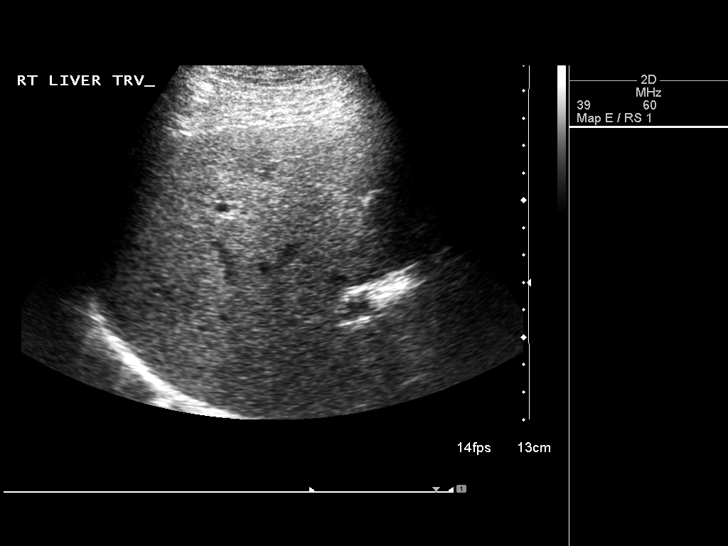
[im 31/47]
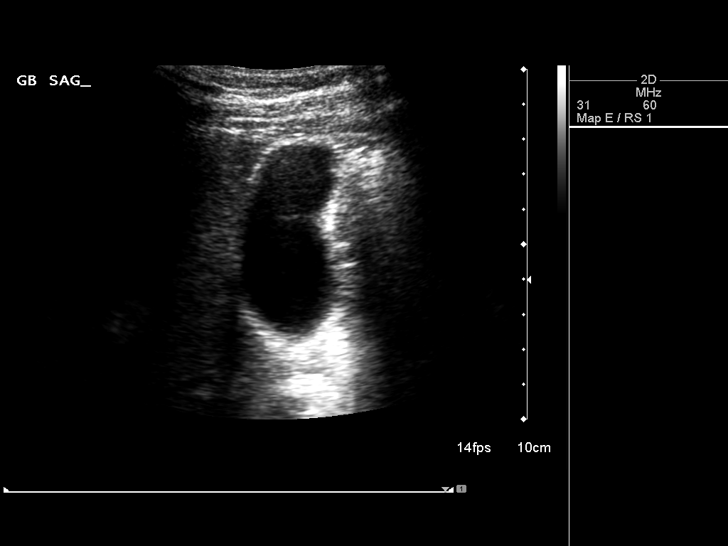
[im 35/47]
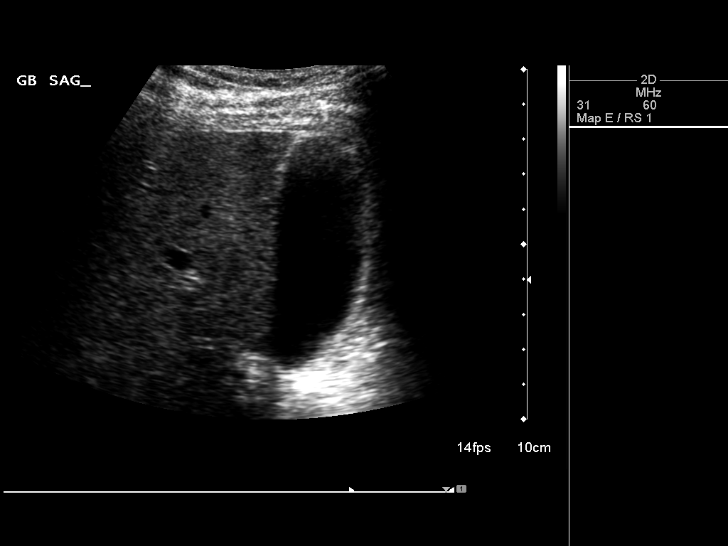
[im 39/47]
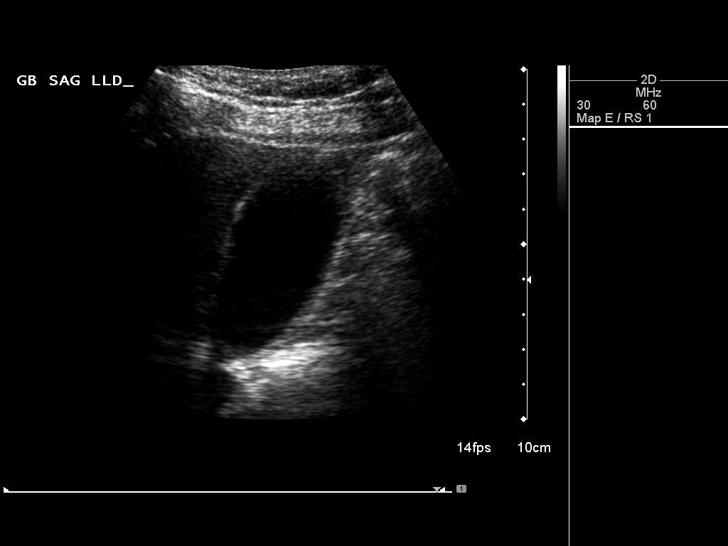
[im 43/47]
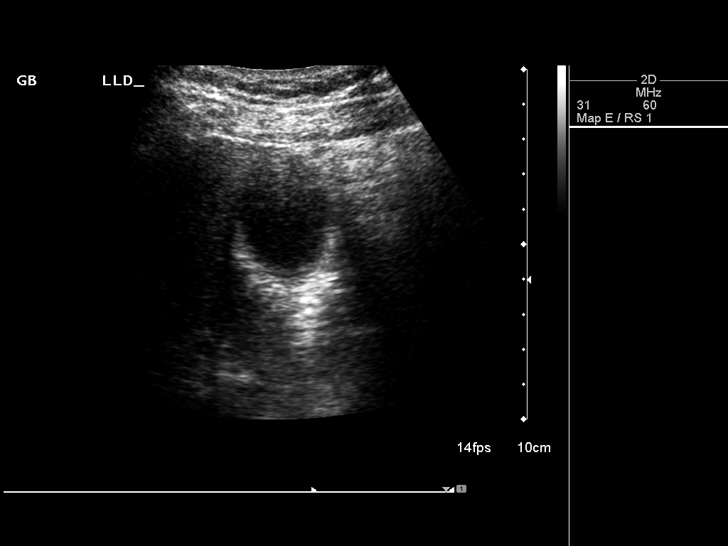
[im 47/47]
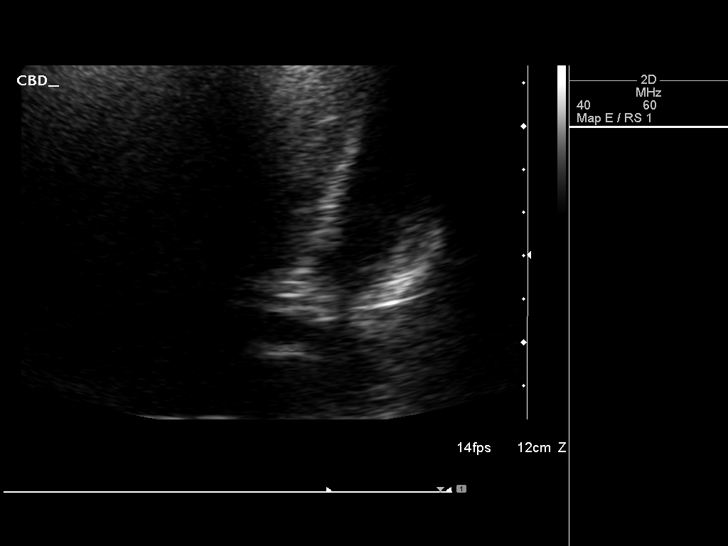

[14 of 25 positions shown; findings below may reference images not displayed]

FINDINGS: Gallbladder:

No gallstones or wall thickening visualized. No sonographic Murphy
sign noted.

Common bile duct:

Diameter: 3.6 mm

Liver:

No focal lesion identified. Within normal limits in parenchymal
echogenicity.
IMPRESSION: No acute abnormality noted.

## 2016-03-30 IMAGING — US US SCROTUM
1 series · 13 of 25 positions shown · non-contrast
Comparison: None.

CLINICAL DATA: Right scrotal fullness

EXAM:
ULTRASOUND OF SCROTUM
TECHNIQUE: Complete ultrasound examination of the testicles, epididymis, and
other scrotal structures was performed.

[Series 1: us scrotum · 0.08mm/px · 13 of 42 slices shown]
[im 1/42]
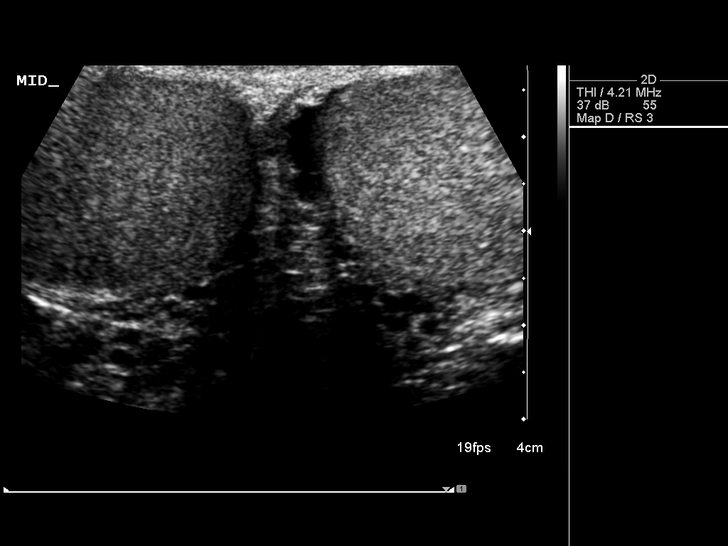
[im 4/42]
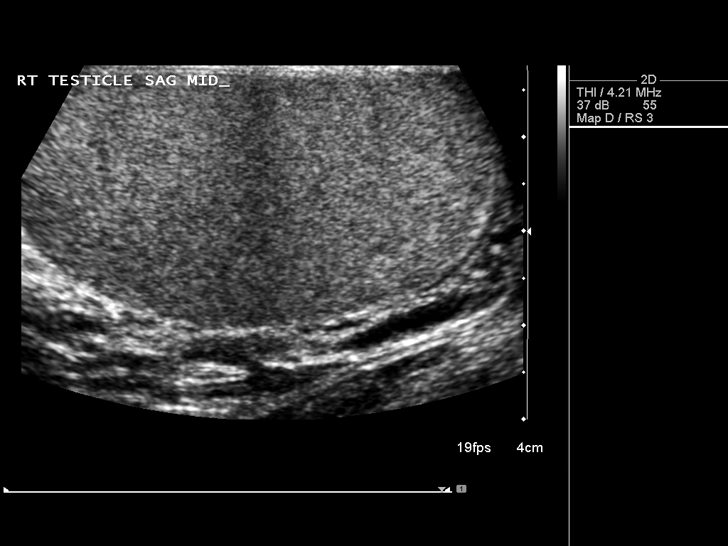
[im 7/42]
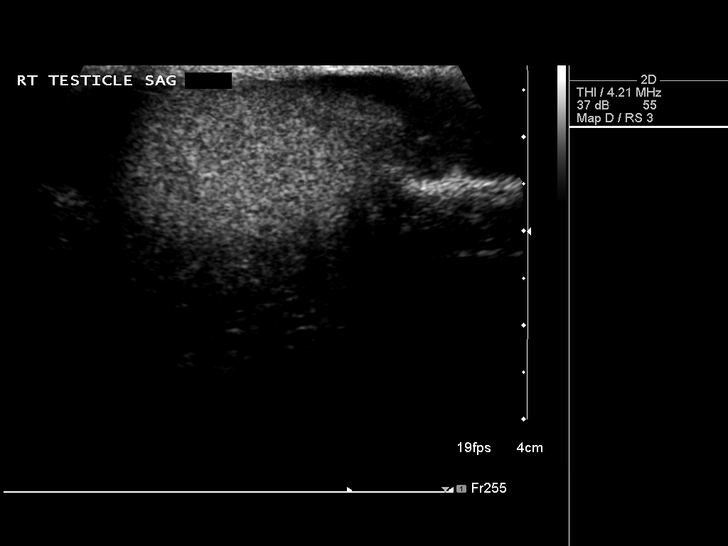
[im 11/42]
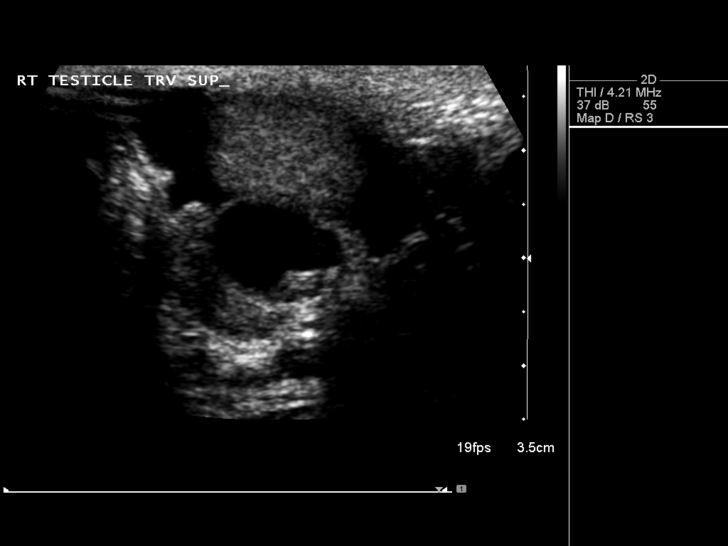
[im 14/42]
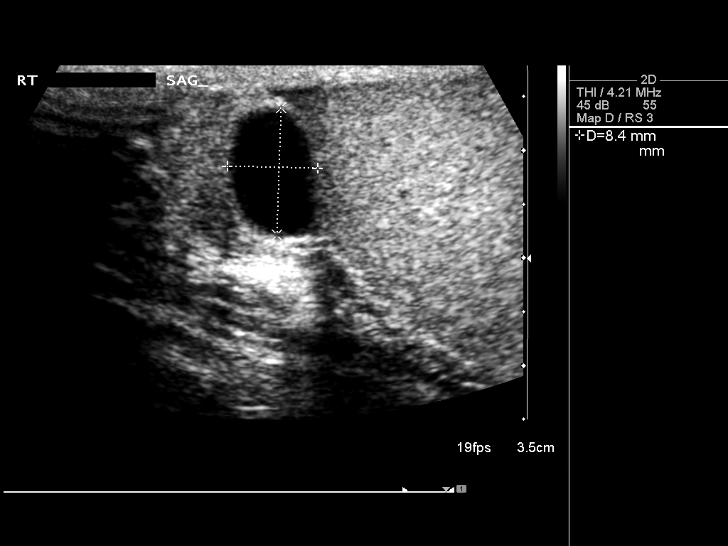
[im 18/42]
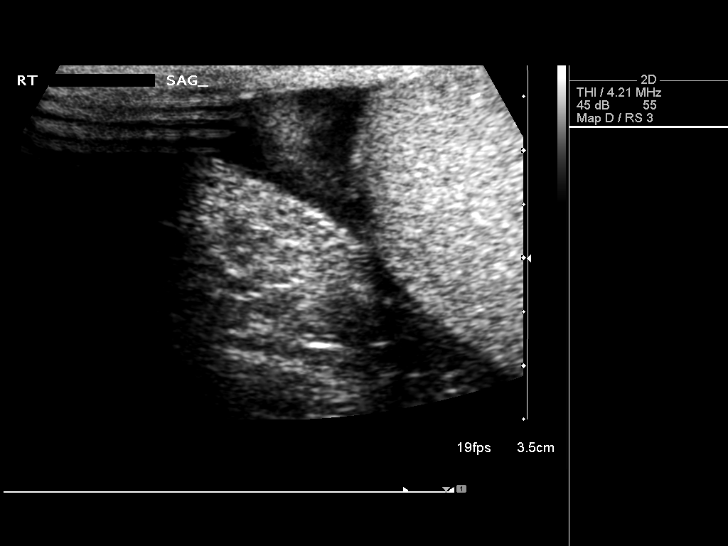
[im 21/42]
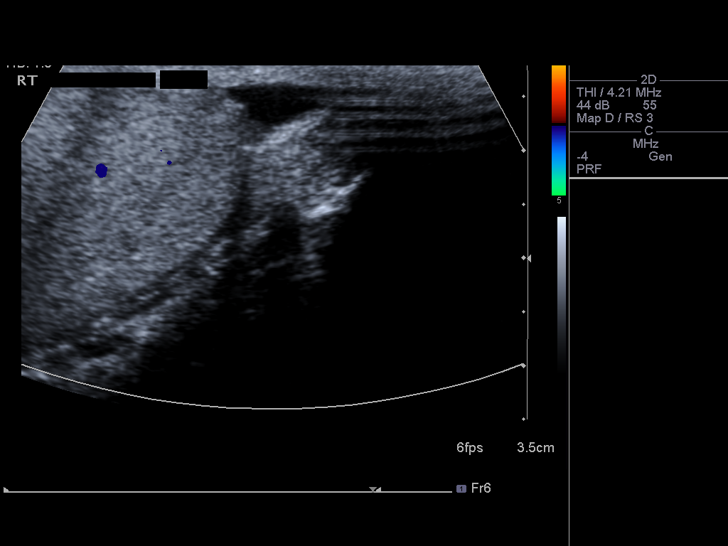
[im 24/42]
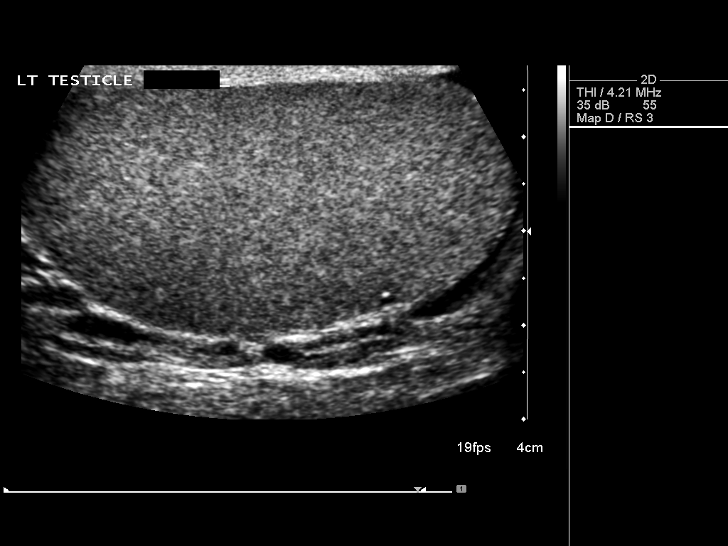
[im 28/42]
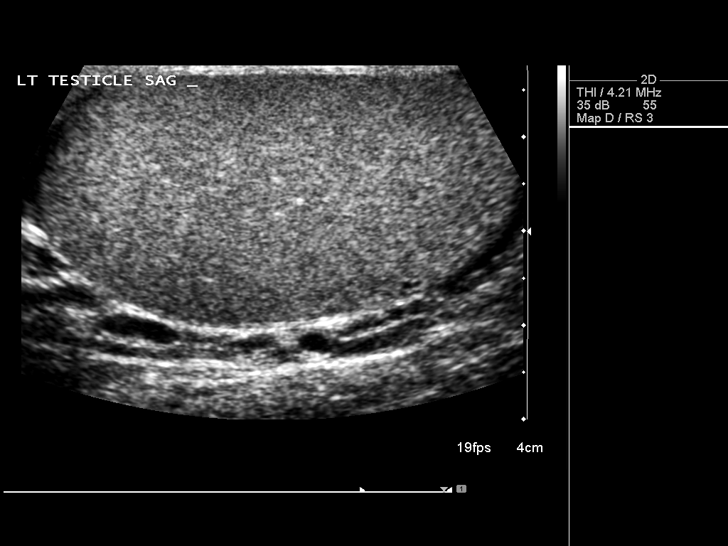
[im 31/42]
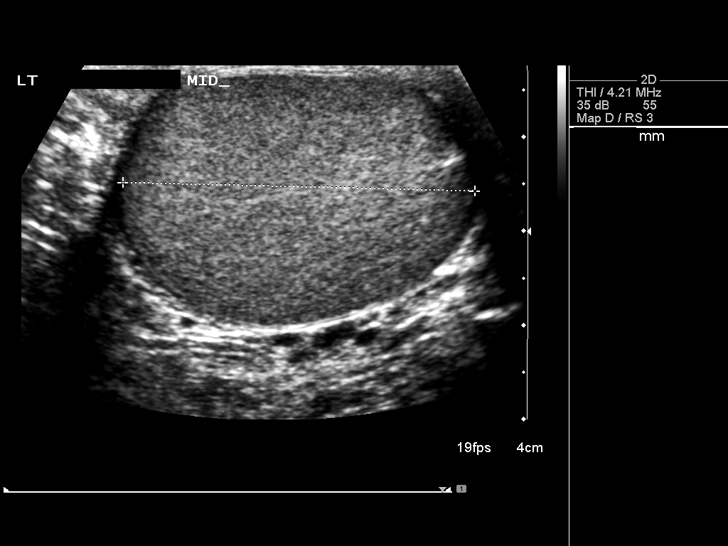
[im 35/42]
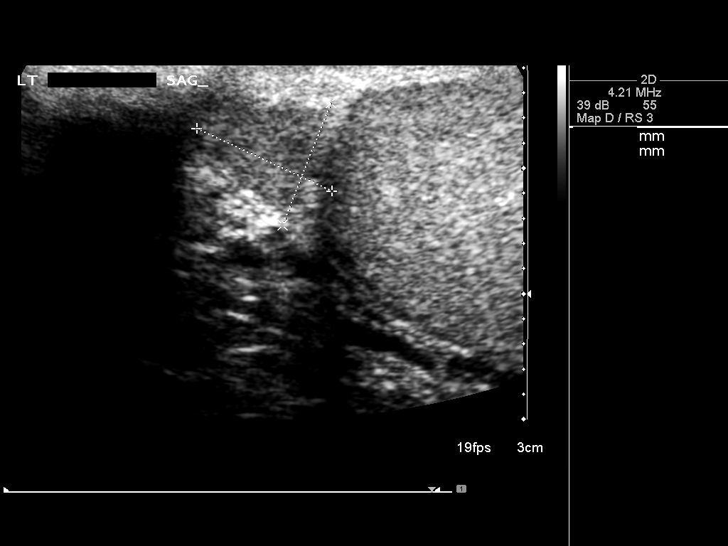
[im 38/42]
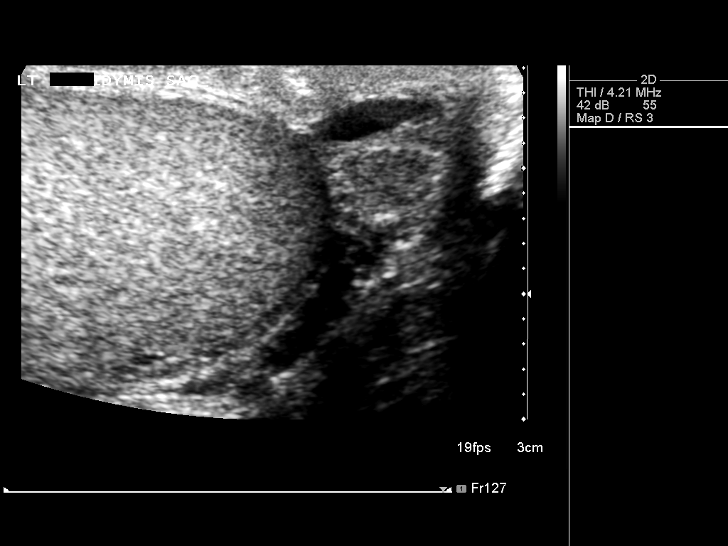
[im 42/42]
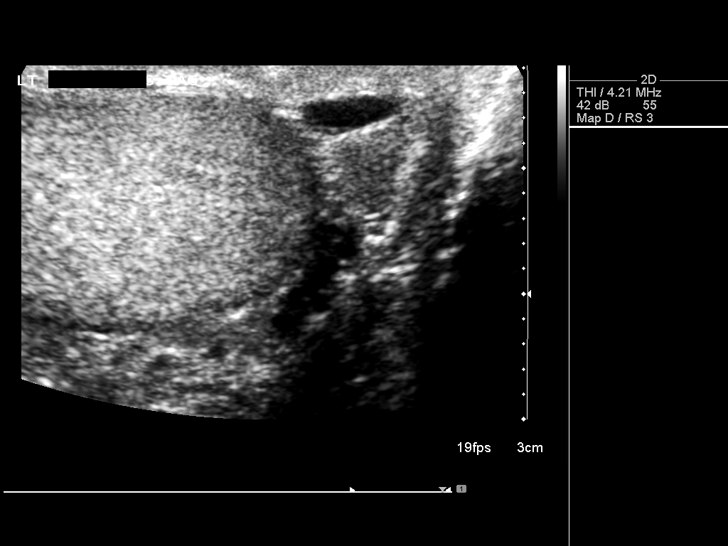

[13 of 25 positions shown; findings below may reference images not displayed]

FINDINGS: Right testicle

Measurements: 5.1 x 2.7 x 3.4 cm. No mass or microlithiasis
visualized. Color flow is seen in the right testis.

Left testicle

Measurements: 5.3 x 2.7 x 3.7 cm. No mass or microlithiasis
visualized. Color flow is seen in the left testis.

Right epididymis: There is a cyst arising from the head of the
epididymis on the right measuring 0.8 x 1.2 x 1.0 cm. No other
extratesticular masses seen on the right. The epididymis on the
right does not appear inflamed or hyperemic.

Left epididymis:  Normal in size and appearance.

Hydrocele:  Small hydroceles bilaterally.

Varicocele:  None visualized.

No scrotal abscess or scrotal wall thickening on either side.
IMPRESSION: Cyst arising from the head of the right epididymis measuring 0.8 x
1.2 x 1.0 cm. Small hydroceles bilaterally. Study otherwise
unremarkable. In particular, no evidence of intratesticular mass on
either side. Color flow is seen in each testis. No inflammatory foci
appreciable on either side.

## 2016-07-11 MED FILL — SYNTHROID 88 MCG TABLET: 88 | 90 days supply | Qty: 90 | Fill #1

## 2016-10-30 MED FILL — SYNTHROID 88 MCG TABLET: 88 | 90 days supply | Qty: 90 | Fill #0

## 2016-11-24 DIAGNOSIS — E559 Vitamin D deficiency, unspecified: Secondary | ICD-10-CM | POA: Diagnosis not present

## 2016-11-24 DIAGNOSIS — E039 Hypothyroidism, unspecified: Secondary | ICD-10-CM | POA: Diagnosis not present

## 2016-11-24 DIAGNOSIS — E785 Hyperlipidemia, unspecified: Secondary | ICD-10-CM | POA: Diagnosis not present

## 2016-12-02 DIAGNOSIS — Z0000 Encounter for general adult medical examination without abnormal findings: Secondary | ICD-10-CM | POA: Diagnosis not present

## 2016-12-02 DIAGNOSIS — E785 Hyperlipidemia, unspecified: Secondary | ICD-10-CM | POA: Diagnosis not present

## 2016-12-02 DIAGNOSIS — R918 Other nonspecific abnormal finding of lung field: Secondary | ICD-10-CM | POA: Diagnosis not present

## 2016-12-02 DIAGNOSIS — Z23 Encounter for immunization: Secondary | ICD-10-CM | POA: Diagnosis not present

## 2016-12-02 DIAGNOSIS — E559 Vitamin D deficiency, unspecified: Secondary | ICD-10-CM | POA: Diagnosis not present

## 2016-12-02 DIAGNOSIS — Z1212 Encounter for screening for malignant neoplasm of rectum: Secondary | ICD-10-CM | POA: Diagnosis not present

## 2016-12-02 DIAGNOSIS — G609 Hereditary and idiopathic neuropathy, unspecified: Secondary | ICD-10-CM | POA: Diagnosis not present

## 2016-12-02 DIAGNOSIS — J309 Allergic rhinitis, unspecified: Secondary | ICD-10-CM | POA: Diagnosis not present

## 2016-12-02 DIAGNOSIS — H10022 Other mucopurulent conjunctivitis, left eye: Secondary | ICD-10-CM | POA: Diagnosis not present

## 2016-12-02 DIAGNOSIS — N503 Cyst of epididymis: Secondary | ICD-10-CM | POA: Diagnosis not present

## 2016-12-02 DIAGNOSIS — E039 Hypothyroidism, unspecified: Secondary | ICD-10-CM | POA: Diagnosis not present

## 2017-02-18 MED FILL — SYNTHROID 88 MCG TABLET: 88 | 90 days supply | Qty: 90 | Fill #0

## 2017-03-12 DIAGNOSIS — H0102A Squamous blepharitis right eye, upper and lower eyelids: Secondary | ICD-10-CM | POA: Diagnosis not present

## 2017-03-12 DIAGNOSIS — H0102B Squamous blepharitis left eye, upper and lower eyelids: Secondary | ICD-10-CM | POA: Diagnosis not present

## 2017-04-13 DIAGNOSIS — N503 Cyst of epididymis: Secondary | ICD-10-CM | POA: Diagnosis not present

## 2017-04-13 DIAGNOSIS — N50819 Testicular pain, unspecified: Secondary | ICD-10-CM | POA: Diagnosis not present

## 2017-04-15 DIAGNOSIS — N503 Cyst of epididymis: Secondary | ICD-10-CM | POA: Diagnosis not present

## 2017-04-15 DIAGNOSIS — N50811 Right testicular pain: Secondary | ICD-10-CM | POA: Diagnosis not present

## 2017-05-27 MED FILL — SYNTHROID 88 MCG TABLET: 88 | 90 days supply | Qty: 90 | Fill #1

## 2017-09-07 MED FILL — SYNTHROID 88 MCG TABLET: 88 | 90 days supply | Qty: 90 | Fill #0

## 2017-12-08 DIAGNOSIS — E785 Hyperlipidemia, unspecified: Secondary | ICD-10-CM | POA: Diagnosis not present

## 2017-12-08 DIAGNOSIS — E039 Hypothyroidism, unspecified: Secondary | ICD-10-CM | POA: Diagnosis not present

## 2017-12-08 DIAGNOSIS — Z1159 Encounter for screening for other viral diseases: Secondary | ICD-10-CM | POA: Diagnosis not present

## 2017-12-08 DIAGNOSIS — Z125 Encounter for screening for malignant neoplasm of prostate: Secondary | ICD-10-CM | POA: Diagnosis not present

## 2017-12-09 MED FILL — SYNTHROID 88 MCG TABLET: 88 | 90 days supply | Qty: 90 | Fill #1

## 2017-12-15 DIAGNOSIS — Z23 Encounter for immunization: Secondary | ICD-10-CM | POA: Diagnosis not present

## 2017-12-15 DIAGNOSIS — G629 Polyneuropathy, unspecified: Secondary | ICD-10-CM | POA: Diagnosis not present

## 2017-12-15 DIAGNOSIS — E559 Vitamin D deficiency, unspecified: Secondary | ICD-10-CM | POA: Diagnosis not present

## 2017-12-15 DIAGNOSIS — N503 Cyst of epididymis: Secondary | ICD-10-CM | POA: Diagnosis not present

## 2017-12-15 DIAGNOSIS — G609 Hereditary and idiopathic neuropathy, unspecified: Secondary | ICD-10-CM | POA: Diagnosis not present

## 2017-12-15 DIAGNOSIS — Z1212 Encounter for screening for malignant neoplasm of rectum: Secondary | ICD-10-CM | POA: Diagnosis not present

## 2017-12-15 DIAGNOSIS — Z86010 Personal history of colonic polyps: Secondary | ICD-10-CM | POA: Diagnosis not present

## 2017-12-15 DIAGNOSIS — Z0000 Encounter for general adult medical examination without abnormal findings: Secondary | ICD-10-CM | POA: Diagnosis not present

## 2017-12-15 DIAGNOSIS — E039 Hypothyroidism, unspecified: Secondary | ICD-10-CM | POA: Diagnosis not present

## 2017-12-15 DIAGNOSIS — E785 Hyperlipidemia, unspecified: Secondary | ICD-10-CM | POA: Diagnosis not present

## 2017-12-15 DIAGNOSIS — R918 Other nonspecific abnormal finding of lung field: Secondary | ICD-10-CM | POA: Diagnosis not present

## 2017-12-25 DIAGNOSIS — Z23 Encounter for immunization: Secondary | ICD-10-CM | POA: Diagnosis not present

## 2018-02-05 ENCOUNTER — Telehealth: Payer: Self-pay | Admitting: Internal Medicine

## 2018-02-05 NOTE — Telephone Encounter (Signed)
Received records from [place] to be reviewed...pt had last colon 2014 pt pcp is recommended Dr.Gessner. Report was placed on doctor desk to be reviewed.

## 2018-02-15 ENCOUNTER — Telehealth: Payer: Self-pay | Admitting: Internal Medicine

## 2018-02-15 ENCOUNTER — Encounter: Payer: Self-pay | Admitting: Internal Medicine

## 2018-02-15 NOTE — Telephone Encounter (Signed)
Need to contact patient  Current guidelines tell us he does not need a routine colonoscopy until 2024 - place 11/2022 recall please  Let him know I reviewed the records and as long as he does not have close relatives with colon cancer (mother, father, brother, sister) or bowel habit changes, rectal bleeding etc,  then this recommendation stands

## 2018-02-15 NOTE — Telephone Encounter (Signed)
Spoke with patient and advised and he understood for the 2024 recall colon.

## 2018-02-15 NOTE — Telephone Encounter (Signed)
This encounter was created in error - please disregard.

## 2018-04-02 MED FILL — SYNTHROID 88 MCG TABLET: 88 | 90 days supply | Qty: 90 | Fill #2

## 2018-07-06 MED FILL — SYNTHROID 88 MCG TABLET: 88 | 90 days supply | Qty: 90 | Fill #3

## 2018-08-16 ENCOUNTER — Other Ambulatory Visit: Payer: Self-pay

## 2018-10-12 MED FILL — SYNTHROID 88 MCG TABLET: 88 | 90 days supply | Qty: 90 | Fill #0

## 2018-12-08 ENCOUNTER — Other Ambulatory Visit: Payer: Self-pay

## 2018-12-16 DIAGNOSIS — Z125 Encounter for screening for malignant neoplasm of prostate: Secondary | ICD-10-CM | POA: Diagnosis not present

## 2018-12-16 DIAGNOSIS — E039 Hypothyroidism, unspecified: Secondary | ICD-10-CM | POA: Diagnosis not present

## 2018-12-16 DIAGNOSIS — E785 Hyperlipidemia, unspecified: Secondary | ICD-10-CM | POA: Diagnosis not present

## 2019-01-03 MED FILL — SYNTHROID 88 MCG TABLET: 88 | 90 days supply | Qty: 90 | Fill #1

## 2019-01-25 ENCOUNTER — Telehealth: Payer: Self-pay | Admitting: Internal Medicine

## 2019-01-25 NOTE — Telephone Encounter (Signed)
Happy to do telehealth/virtual visit  Can we set up as a My Chart virtual ?

## 2019-01-26 ENCOUNTER — Encounter: Payer: Self-pay | Admitting: Gastroenterology

## 2019-01-27 ENCOUNTER — Telehealth (INDEPENDENT_AMBULATORY_CARE_PROVIDER_SITE_OTHER): Payer: Medicare Other | Admitting: Internal Medicine

## 2019-01-27 ENCOUNTER — Ambulatory Visit: Payer: 59 | Admitting: Internal Medicine

## 2019-01-27 DIAGNOSIS — Z86010 Personal history of colonic polyps: Secondary | ICD-10-CM | POA: Diagnosis not present

## 2019-01-27 DIAGNOSIS — K581 Irritable bowel syndrome with constipation: Secondary | ICD-10-CM | POA: Insufficient documentation

## 2019-01-27 NOTE — Patient Instructions (Addendum)
As we discussed it sounds like you have Irritable Bowel Syndrome with Constipation - sometimes you have loose stools but since you only defecate about every 3-4 days it is a constipation issue.  Since it does not affect quality of life and is a chronic issue not representing a change in bowel habits no need to investigate or treat.  Next routine colonoscopy in 2024.  I appreciate the opportunity to care for you.  Gatha Mayer, MD, [facility]

## 2019-01-27 NOTE — Progress Notes (Signed)
    TELEHEALTH ENCOUNTER IN SETTING OF COVID-19 PANDEMIC - REQUESTED BY PATIENT SERVICE PROVIDED BY TELEMEDECINE - TYPE: My Chart AVPATIENT LOCATION: home PATIENT HAS CONSENTED TO TELEHEALTH VISIT PROVIDER LOCATION: OFFICE REFERRING PROVIDER:Walter Pharr PARTICIPANTS OTHER THAN PATIENT:none TIME SPENT ON CALL: 14 mins    Jesus Sweeney 69 y.o. 1950-04-22 [id number]  Assessment & Plan:   Encounter Diagnoses  Name Primary?  . Irritable bowel syndrome with constipation Yes  . Hx of adenomatous polyp of colon      Sounds like Irritable Bowel Syndrome with Constipation   Since it does not affect quality of life and is a chronic issue not representing a change in bowel habits no need to investigate or treat.  Next routine colonoscopy in 2024.Diminutive adenoma 2008, no polyps 2014 so 10 yrs for next routine colonoscopy  [id number], Thayer Jew, MD   Subjective:   Chief Complaint: constipation ? Needs colonoscopy  HPI 69 yo wm with chronic hx of 3-4 days w/o defecation and then several BM's - large. Pattern x years. Saw dr. Shelia Media recently and mentioned this and Dr. Shelia Media sent him for consideration of additional evaluation. This does not represent a change in bowel habits, no bleeding and does not affect quality of life. Eats high fiber cereal, has relatively healthy diet - salad at lunch and protein and vegetables for supper.  2 prior colonoscopies Dr. Amedeo Plenty 2008 diminutive adenoma and no polyps 2014. Does have left diverticulosis.   Allergies  Allergen Reactions  . Eggs Or Egg-Derived Products Other (See Comments)    Egg based meds but pt eats eggs   No outpatient medications have been marked as taking for the 01/27/19 encounter (Video Visit) with Gatha Mayer, MD.   Past Medical History:  Diagnosis Date  . Adenomatous colon polyp   . Allergic rhinitis   . Epididymal cyst   . Hyperlipidemia   . Hyperproteinemia   . Hypothyroidism   . IBS (irritable bowel syndrome)   .  Lumbar spinal stenosis   . Multiple lung nodules   . Polyneuropathy   . Spermatocele of epididymis, single   . Tinnitus of both ears   . Vertigo, benign positional   . Vitamin D deficiency    Past Surgical History:  Procedure Laterality Date  . APPENDECTOMY    . Bone Spur      upper left arm age 62  . COLONOSCOPY     2008, 2014   Social History   Social History Narrative   Married, wife is Therapist, sports   2 children   Works from home - [employer], never smoker   family history includes CAD in his father; Cerebrovascular Disease in his mother; Cirrhosis in his mother; Heart disease in his father; Hypertension in his brother; Other in his brother and sister; Stroke in his mother.   Review of Systems As per HPI, o/w neg  12/16/2018  NL CBC NL CMET NL TSH

## 2019-01-29 ENCOUNTER — Encounter: Payer: Self-pay | Admitting: Internal Medicine

## 2019-03-15 NOTE — Telephone Encounter (Signed)
Pt seen 01/27/2019

## 2019-04-13 MED FILL — SYNTHROID 88 MCG TABLET: 88 | 90 days supply | Qty: 90 | Fill #0

## 2019-05-05 DIAGNOSIS — H04123 Dry eye syndrome of bilateral lacrimal glands: Secondary | ICD-10-CM | POA: Diagnosis not present

## 2019-07-13 MED FILL — SYNTHROID 88 MCG TABLET: 88 | 90 days supply | Qty: 90 | Fill #1

## 2019-10-14 DIAGNOSIS — Z23 Encounter for immunization: Secondary | ICD-10-CM | POA: Diagnosis not present

## 2019-10-22 MED FILL — SYNTHROID 88 MCG TABLET: 88 | 90 days supply | Qty: 90 | Fill #0

## 2019-12-19 DIAGNOSIS — Z23 Encounter for immunization: Secondary | ICD-10-CM | POA: Diagnosis not present

## 2019-12-26 DIAGNOSIS — E785 Hyperlipidemia, unspecified: Secondary | ICD-10-CM | POA: Diagnosis not present

## 2019-12-26 DIAGNOSIS — Z125 Encounter for screening for malignant neoplasm of prostate: Secondary | ICD-10-CM | POA: Diagnosis not present

## 2019-12-26 DIAGNOSIS — E039 Hypothyroidism, unspecified: Secondary | ICD-10-CM | POA: Diagnosis not present

## 2019-12-29 DIAGNOSIS — R918 Other nonspecific abnormal finding of lung field: Secondary | ICD-10-CM | POA: Diagnosis not present

## 2019-12-29 DIAGNOSIS — E039 Hypothyroidism, unspecified: Secondary | ICD-10-CM | POA: Diagnosis not present

## 2019-12-29 DIAGNOSIS — L509 Urticaria, unspecified: Secondary | ICD-10-CM | POA: Diagnosis not present

## 2019-12-29 DIAGNOSIS — E785 Hyperlipidemia, unspecified: Secondary | ICD-10-CM | POA: Diagnosis not present

## 2019-12-29 DIAGNOSIS — J309 Allergic rhinitis, unspecified: Secondary | ICD-10-CM | POA: Diagnosis not present

## 2019-12-29 DIAGNOSIS — Z86010 Personal history of colonic polyps: Secondary | ICD-10-CM | POA: Diagnosis not present

## 2019-12-29 DIAGNOSIS — Z0000 Encounter for general adult medical examination without abnormal findings: Secondary | ICD-10-CM | POA: Diagnosis not present

## 2020-01-02 DIAGNOSIS — E039 Hypothyroidism, unspecified: Secondary | ICD-10-CM | POA: Diagnosis not present

## 2020-01-11 MED FILL — SYNTHROID 88 MCG TABLET: 88 | 90 days supply | Qty: 90 | Fill #1

## 2020-02-29 DIAGNOSIS — Z23 Encounter for immunization: Secondary | ICD-10-CM | POA: Diagnosis not present

## 2020-05-02 ENCOUNTER — Other Ambulatory Visit (HOSPITAL_COMMUNITY): Payer: Self-pay

## 2020-08-31 DIAGNOSIS — T148XXA Other injury of unspecified body region, initial encounter: Secondary | ICD-10-CM | POA: Diagnosis not present

## 2020-08-31 DIAGNOSIS — S60551A Superficial foreign body of right hand, initial encounter: Secondary | ICD-10-CM | POA: Diagnosis not present

## 2020-09-18 DIAGNOSIS — M79641 Pain in right hand: Secondary | ICD-10-CM | POA: Diagnosis not present

## 2020-09-18 DIAGNOSIS — S60551A Superficial foreign body of right hand, initial encounter: Secondary | ICD-10-CM | POA: Diagnosis not present

## 2022-11-25 ENCOUNTER — Encounter: Payer: Self-pay | Admitting: Internal Medicine
